# Patient Record
Sex: Male | Born: 1975 | Race: Black or African American | Hispanic: No | Marital: Single | Smoking: Current every day smoker
Health system: Southern US, Community
[De-identification: ages and names within clinical notes are randomized; demographics above are authoritative.]

## PROBLEM LIST (undated history)

## (undated) DIAGNOSIS — R079 Chest pain, unspecified: Secondary | ICD-10-CM

## (undated) DIAGNOSIS — Z8669 Personal history of other diseases of the nervous system and sense organs: Secondary | ICD-10-CM

## (undated) DIAGNOSIS — R109 Unspecified abdominal pain: Secondary | ICD-10-CM

## (undated) HISTORY — DX: Unspecified abdominal pain: R10.9

## (undated) HISTORY — DX: Chest pain, unspecified: R07.9

## (undated) HISTORY — DX: Personal history of other diseases of the nervous system and sense organs: Z86.69

---

## 2002-06-03 ENCOUNTER — Emergency Department (HOSPITAL_COMMUNITY): Admission: EM | Admit: 2002-06-03 | Discharge: 2002-06-03 | Payer: Self-pay | Admitting: Emergency Medicine

## 2010-10-24 DIAGNOSIS — Z8669 Personal history of other diseases of the nervous system and sense organs: Secondary | ICD-10-CM

## 2010-10-24 HISTORY — DX: Personal history of other diseases of the nervous system and sense organs: Z86.69

## 2011-10-23 ENCOUNTER — Emergency Department (HOSPITAL_COMMUNITY)
Admission: EM | Admit: 2011-10-23 | Discharge: 2011-10-23 | Disposition: A | Discharge status: Home / Self Care | Payer: Self-pay | Attending: Emergency Medicine | Admitting: Emergency Medicine

## 2011-10-23 ENCOUNTER — Encounter: Payer: Self-pay | Admitting: *Deleted

## 2011-10-23 DIAGNOSIS — L2989 Other pruritus: Secondary | ICD-10-CM | POA: Insufficient documentation

## 2011-10-23 DIAGNOSIS — H5789 Other specified disorders of eye and adnexa: Secondary | ICD-10-CM | POA: Insufficient documentation

## 2011-10-23 DIAGNOSIS — L298 Other pruritus: Secondary | ICD-10-CM | POA: Insufficient documentation

## 2011-10-23 DIAGNOSIS — F17200 Nicotine dependence, unspecified, uncomplicated: Secondary | ICD-10-CM | POA: Insufficient documentation

## 2011-10-23 DIAGNOSIS — H109 Unspecified conjunctivitis: Secondary | ICD-10-CM | POA: Insufficient documentation

## 2011-10-23 MED ORDER — FLUORESCEIN SODIUM 1 MG OP STRP
1.0000 | ORAL_STRIP | Freq: Once | OPHTHALMIC | Status: AC
  Administered 2011-10-23: 1 via OPHTHALMIC
  Filled 2011-10-23: qty 1

## 2011-10-23 MED ORDER — TETRACAINE HCL 0.5 % OP SOLN
2.0000 [drp] | Freq: Once | OPHTHALMIC | Status: AC
  Administered 2011-10-23: 2 [drp] via OPHTHALMIC
  Filled 2011-10-23: qty 2

## 2011-10-23 MED ORDER — GATIFLOXACIN 0.5 % OP SOLN
1.0000 [drp] | Freq: Once | OPHTHALMIC | Status: AC
  Administered 2011-10-23: 1 [drp] via OPHTHALMIC
  Filled 2011-10-23 (×2): qty 2.5

## 2011-10-23 NOTE — ED Provider Notes (Signed)
History     CSN: [id number]  Arrival date & time 10/23/11  1039   First MD Initiated Contact with Patient 10/23/11 1054      Chief Complaint  Patient presents with  . Conjunctivitis    (Consider location/radiation/quality/duration/timing/severity/associated sxs/prior treatment) HPI Patient is a 35 yo M who presents today complaining of left eye redness and itching for the past 4 days.  He has no history of trauma or visual changes.  He has no foreign body sensation.  He is not a contact lens wearer.  He describes only mild pain made worse with light and better with darkness.  There are no other associated or modifying factors. History reviewed. No pertinent past medical history.  History reviewed. No pertinent past surgical history.  History reviewed. No pertinent family history.  History  Substance Use Topics  . Smoking status: Current Everyday Smoker -- 0.5 packs/day    Types: Cigarettes  . Smokeless tobacco: Not on file  . Alcohol Use: No      Review of Systems  Constitutional: Negative.   HENT: Negative.   Eyes: Positive for photophobia, discharge, redness and itching.  Respiratory: Negative.   Cardiovascular: Negative.   Gastrointestinal: Negative.   Genitourinary: Negative.   Musculoskeletal: Negative.   Skin: Negative.   Neurological: Negative.   Hematological: Negative.   Psychiatric/Behavioral: Negative.   All other systems reviewed and are negative.    Allergies  Review of patient's allergies indicates no known allergies.  Home Medications   Current Outpatient Rx  Name Route Sig Dispense Refill  . AMOXICILLIN 500 MG PO CAPS Oral Take 500 mg by mouth once. Antibiotics left over from previous illness.     . IBUPROFEN 200 MG PO TABS Oral Take 400 mg by mouth every 6 (six) hours as needed. For pain       BP 140/93  Pulse 76  Temp(Src) 98.3 F (36.8 C) (Oral)  Resp 18  SpO2 100%  Physical Exam  Nursing note and vitals  reviewed. Constitutional: He is oriented to person, place, and time. He appears well-developed and well-nourished. No distress.  HENT:  Head: Normocephalic and atraumatic.  Eyes: EOM are normal. Pupils are equal, round, and reactive to light. Left conjunctiva is injected.  Slit lamp exam:      The left eye shows no fluorescein uptake.  Neck: Normal range of motion.  Cardiovascular: Normal rate.   Pulmonary/Chest: Effort normal.  Musculoskeletal: Normal range of motion.  Neurological: He is alert and oriented to person, place, and time. No cranial nerve deficit.  Skin: Skin is warm and dry.  Psychiatric: He has a normal mood and affect.    ED Course  Procedures (including critical care time)  Labs Reviewed - No data to display No results found.   1. Conjunctivitis       MDM  Patient was evaluated and had presentation consistent with conjunctivitis.  He was treated with gatifloxacin.  He had no signs of corneal abrasion or history findings consistent with other concerning causes of eye redness. Patient was discharged in good condition with eye drops.        Cyndra Numbers, MD 10/23/11 2209

## 2011-10-23 NOTE — ED Notes (Signed)
Pt d/c home in NAD.

## 2011-10-23 NOTE — ED Notes (Signed)
MD at bedside. 

## 2011-10-23 NOTE — ED Notes (Signed)
Reports itching and redness to left eye since 12/25.

## 2014-01-20 ENCOUNTER — Emergency Department (HOSPITAL_COMMUNITY)
Admission: EM | Admit: 2014-01-20 | Discharge: 2014-01-20 | Disposition: A | Discharge status: Home / Self Care | Payer: Self-pay | Attending: Emergency Medicine | Admitting: Emergency Medicine

## 2014-01-20 ENCOUNTER — Encounter (HOSPITAL_COMMUNITY): Payer: Self-pay | Admitting: Emergency Medicine

## 2014-01-20 ENCOUNTER — Emergency Department (HOSPITAL_COMMUNITY): Payer: Self-pay

## 2014-01-20 DIAGNOSIS — R109 Unspecified abdominal pain: Secondary | ICD-10-CM

## 2014-01-20 DIAGNOSIS — R079 Chest pain, unspecified: Secondary | ICD-10-CM

## 2014-01-20 DIAGNOSIS — R0789 Other chest pain: Secondary | ICD-10-CM | POA: Insufficient documentation

## 2014-01-20 DIAGNOSIS — F17200 Nicotine dependence, unspecified, uncomplicated: Secondary | ICD-10-CM | POA: Insufficient documentation

## 2014-01-20 HISTORY — DX: Unspecified abdominal pain: R10.9

## 2014-01-20 HISTORY — DX: Chest pain, unspecified: R07.9

## 2014-01-20 LAB — URINALYSIS, ROUTINE W REFLEX MICROSCOPIC
Bilirubin Urine: NEGATIVE
GLUCOSE, UA: NEGATIVE mg/dL
Hgb urine dipstick: NEGATIVE
Ketones, ur: NEGATIVE mg/dL
LEUKOCYTES UA: NEGATIVE
Nitrite: NEGATIVE
PH: 6.5 (ref 5.0–8.0)
PROTEIN: NEGATIVE mg/dL
SPECIFIC GRAVITY, URINE: 1.026 (ref 1.005–1.030)
Urobilinogen, UA: 1 mg/dL (ref 0.0–1.0)

## 2014-01-20 LAB — CBC
HEMATOCRIT: 45.4 % (ref 39.0–52.0)
HEMOGLOBIN: 15.8 g/dL (ref 13.0–17.0)
MCH: 32.5 pg (ref 26.0–34.0)
MCHC: 34.8 g/dL (ref 30.0–36.0)
MCV: 93.4 fL (ref 78.0–100.0)
Platelets: 218 10*3/uL (ref 150–400)
RBC: 4.86 MIL/uL (ref 4.22–5.81)
RDW: 14.5 % (ref 11.5–15.5)
WBC: 8.1 10*3/uL (ref 4.0–10.5)

## 2014-01-20 LAB — I-STAT TROPONIN, ED: Troponin i, poc: 0 ng/mL (ref 0.00–0.08)

## 2014-01-20 LAB — BASIC METABOLIC PANEL
BUN: 12 mg/dL (ref 6–23)
CHLORIDE: 103 meq/L (ref 96–112)
CO2: 27 meq/L (ref 19–32)
Calcium: 9.1 mg/dL (ref 8.4–10.5)
Creatinine, Ser: 0.99 mg/dL (ref 0.50–1.35)
GFR calc non Af Amer: >90 mL/min (ref >90)
GLUCOSE: 97 mg/dL (ref 70–99)
POTASSIUM: 4.5 meq/L (ref 3.7–5.3)
Sodium: 143 mEq/L (ref 137–147)

## 2014-01-20 MED ORDER — OXYCODONE-ACETAMINOPHEN 5-325 MG PO TABS
2.0000 | ORAL_TABLET | Freq: Once | ORAL | Status: AC
  Administered 2014-01-20: 2 via ORAL
  Filled 2014-01-20: qty 2

## 2014-01-20 MED ORDER — OXYCODONE-ACETAMINOPHEN 5-325 MG PO TABS: 1.0000 | ORAL_TABLET | ORAL | Status: DC | PRN

## 2014-01-20 NOTE — Discharge Instructions (Signed)

## 2014-01-20 NOTE — ED Notes (Signed)
Per pt sts he has been having epigastric pain since about Saturday. Denies N,V,D. sts the pain is constant and hurts when he breathes, laughes and moves a certain way.

## 2014-01-20 NOTE — ED Provider Notes (Signed)
This patient was seen and evaluated in conjunction with the resident physician, Dr. Marcha SoldersBrtalik.  The documentation accurately reflects the patient's ED evaluation.  On my exam, the patient was in no distress.  We discussed all findings, the need for smoking cessation, and the need for further E/M as an outpatient.  I saw the ECG, SR, 80, non-ischemic. Unremarkable.  Alejandro Munchobert Assad Harbeson, MD 01/20/14 [phone]

## 2014-01-20 NOTE — ED Provider Notes (Signed)
CSN: [id number]     Arrival date & time 01/20/14  1133 History   First MD Initiated Contact with Patient 01/20/14 1652     Chief Complaint  Patient presents with  . Abdominal Pain     (Consider location/radiation/quality/duration/timing/severity/associated sxs/prior Treatment) Patient is a 38 y.o. male presenting with chest pain. The history is provided by the patient.  Chest Pain Pain location:  Substernal area Pain quality: aching   Pain radiates to:  Does not radiate Pain radiates to the back: no   Pain severity now: no pain at rest, mild pain wiht movement. Onset quality:  Gradual Duration:  3 days Timing:  Constant Progression:  Unchanged Chronicity:  New Context: not breathing and not at rest   Context comment:  Pain with moving certain ways that reproduces pain Relieved by:  Rest Worsened by:  Certain positions Ineffective treatments:  None tried Associated symptoms: no abdominal pain, no altered mental status, no back pain, no cough, no dizziness, no fever, no lower extremity edema, no nausea, no numbness, no PND, no shortness of breath, no syncope, not vomiting and no weakness   Risk factors: male sex and smoking   Risk factors: no coronary artery disease, no diabetes mellitus, no high cholesterol, no hypertension, not obese and no prior DVT/PE     History reviewed. No pertinent past medical history. History reviewed. No pertinent past surgical history. History reviewed. No pertinent family history. History  Substance Use Topics  . Smoking status: Current Every Day Smoker -- 0.50 packs/day    Types: Cigarettes  . Smokeless tobacco: Not on file  . Alcohol Use: No    Review of Systems  Constitutional: Negative for fever, activity change and appetite change.  HENT: Negative for congestion and rhinorrhea.   Eyes: Negative for discharge and itching.  Respiratory: Negative for cough, shortness of breath and wheezing.   Cardiovascular: Positive for chest pain.  Negative for syncope and PND.  Gastrointestinal: Negative for nausea, vomiting, abdominal pain, diarrhea and constipation.  Genitourinary: Negative for hematuria, decreased urine volume and difficulty urinating.  Musculoskeletal: Negative for back pain.  Skin: Negative for rash and wound.  Neurological: Negative for dizziness, syncope, weakness and numbness.  All other systems reviewed and are negative.      Allergies  Review of patient's allergies indicates no known allergies.  Home Medications   Current Outpatient Rx  Name  Route  Sig  Dispense  Refill  . naproxen sodium (ANAPROX) 220 MG tablet   Oral   Take 440 mg by mouth 2 (two) times daily as needed (for pain).         Marland Kitchen. oxyCODONE-acetaminophen (PERCOCET/ROXICET) 5-325 MG per tablet   Oral   Take 1 tablet by mouth every 4 (four) hours as needed for severe pain.   20 tablet   0    BP 110/77  Pulse 69  Temp(Src) 97.5 F (36.4 C) (Oral)  Resp 19  Ht 6\' 1"  (1.854 m)  Wt 192 lb (87.091 kg)  BMI 25.34 kg/m2  SpO2 98% Physical Exam  Vitals reviewed. Constitutional: He is oriented to person, place, and time. He appears well-developed and well-nourished. No distress.  HENT:  Head: Normocephalic and atraumatic.  Mouth/Throat: Oropharynx is clear and moist. No oropharyngeal exudate.  Eyes: Conjunctivae and EOM are normal. Pupils are equal, round, and reactive to light. Right eye exhibits no discharge. Left eye exhibits no discharge. No scleral icterus.  Neck: Normal range of motion. Neck supple.  Cardiovascular: Normal rate,  regular rhythm, normal heart sounds and intact distal pulses.  Exam reveals no gallop and no friction rub.   No murmur heard. Pulmonary/Chest: Effort normal and breath sounds normal. No respiratory distress. He has no wheezes. He has no rales. He exhibits tenderness.  Reproducible pain with passive and extension rotation of torso. Mild chest ttp  Abdominal: Soft. He exhibits no distension and no  mass. There is no tenderness.  Musculoskeletal: Normal range of motion.  Neurological: He is alert and oriented to person, place, and time. No cranial nerve deficit. He exhibits normal muscle tone. Coordination normal.  Skin: Skin is warm. No rash noted. He is not diaphoretic.    ED Course  Procedures (including critical care time) Labs Review Labs Reviewed  CBC  BASIC METABOLIC PANEL  URINALYSIS, ROUTINE W REFLEX MICROSCOPIC  I-STAT TROPOININ, ED   Imaging Review Dg Chest 2 View  01/20/2014   CLINICAL DATA:  Shortness of breath and epigastric abdominal pain.  EXAM: CHEST - 2 VIEW  COMPARISON:  None  FINDINGS: The heart size and mediastinal contours are within normal limits. There is no evidence of pulmonary edema, consolidation, pneumothorax, nodule or pleural fluid. The visualized skeletal structures are unremarkable.  IMPRESSION: No active disease.   Electronically Signed   By: Irish Lack M.D.   On: 01/20/2014 12:12     EKG Interpretation   Date/Time:  Monday January 20 2014 11:37:51 EDT Ventricular Rate:  80 PR Interval:  162 QRS Duration: 80 QT Interval:  348 QTC Calculation: 401 R Axis:   39 Text Interpretation:  Normal sinus rhythm Normal ECG Sinus rhythm Normal  ECG Confirmed by Gerhard Munch  MD ([id number]) on 01/20/2014 5:16:19 PM      MDM   MDM: 38 y.o. AAM w/ cc: of chest pain for 3 days. Pain localized to substernum states woke up with it 3 days aago. Has no pain at rest but states that when he moves a certain way, or laughs, it hurts. Denies pain with breathing. Denies f/c, abd pain, n/v/d. No hx of pain. No recent travel, surgery, trauma, hemoptysis, hormones. No hx of blood clot. No medical hx. AFVSS, well appearing, NAD. EKG with no ischemic change. Triage labs and CXR normal. Exam c/w MSK pain. No hx of heavy alcohol use. Abd soft, benign. Mild substernal chest tp and reproduction with twisitng. Likely MSK pain. Unlikely ACS with low risk, low history, negative  EKG, young, negative trop. Unlikely PE as PERC negative. Unlikely pancreatitis as no EtOH use, non tender, no vomiting. Will treat with Percocet, follow up PCP as needed. Discharged. Care of case d/w my attending.  Final diagnoses:  Musculoskeletal chest pain    Discharged  Pilar Jarvis, MD 01/20/14 [phone]

## 2014-01-20 NOTE — ED Notes (Signed)
Pt reports he has to go to pick of child but will come back; RN encouraged pt to stay.

## 2014-02-19 ENCOUNTER — Encounter: Payer: Self-pay | Admitting: Cardiovascular Disease

## 2014-03-14 ENCOUNTER — Encounter: Payer: Self-pay | Admitting: Cardiovascular Disease

## 2014-03-18 ENCOUNTER — Telehealth: Payer: Self-pay | Admitting: Cardiovascular Disease

## 2014-03-18 ENCOUNTER — Encounter: Payer: Self-pay | Admitting: Cardiovascular Disease

## 2015-04-04 IMAGING — CR DG CHEST 2V
2 series · 2 of 2 positions shown · non-contrast
Comparison: None

CLINICAL DATA: Shortness of breath and epigastric abdominal pain.

EXAM:
CHEST - 2 VIEW

[w chest pa]
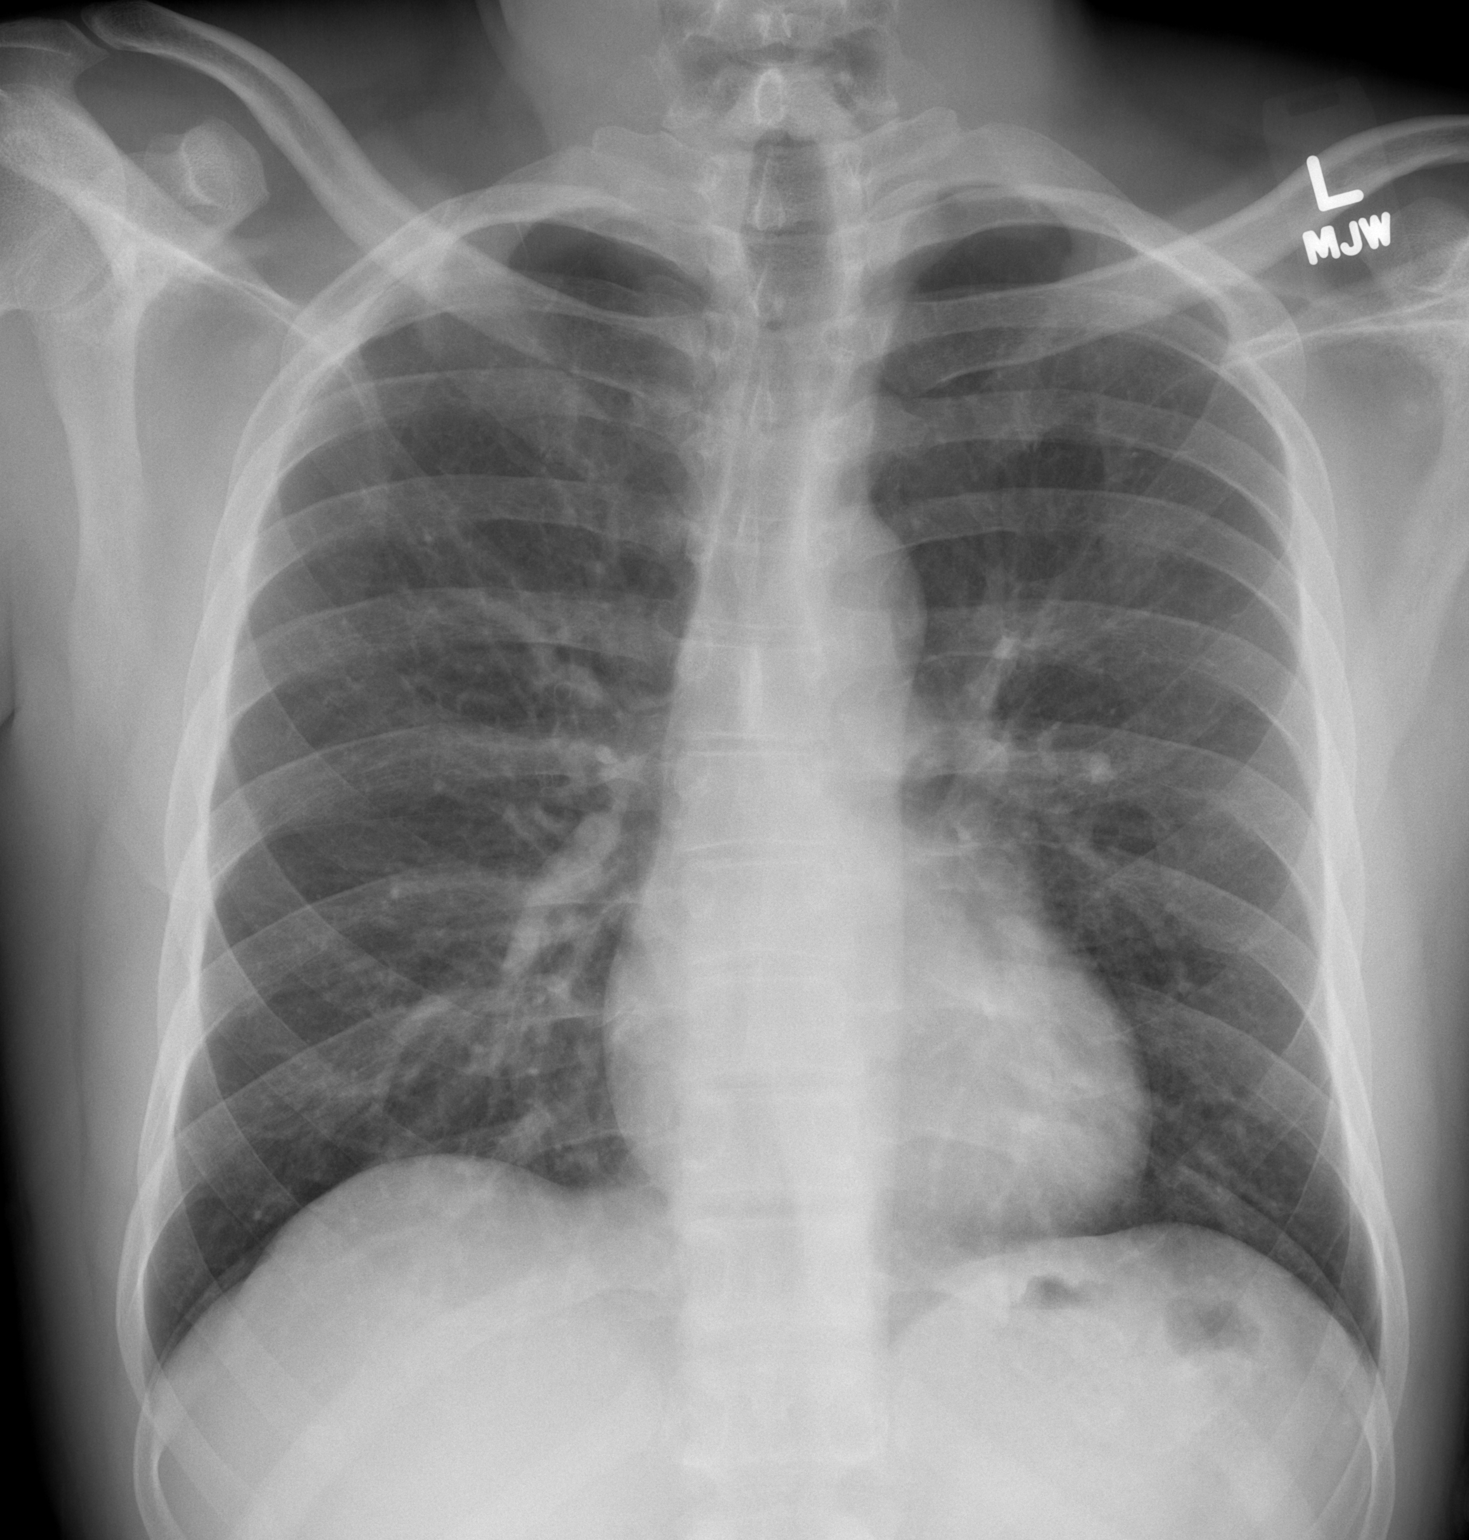

[w chest lat]
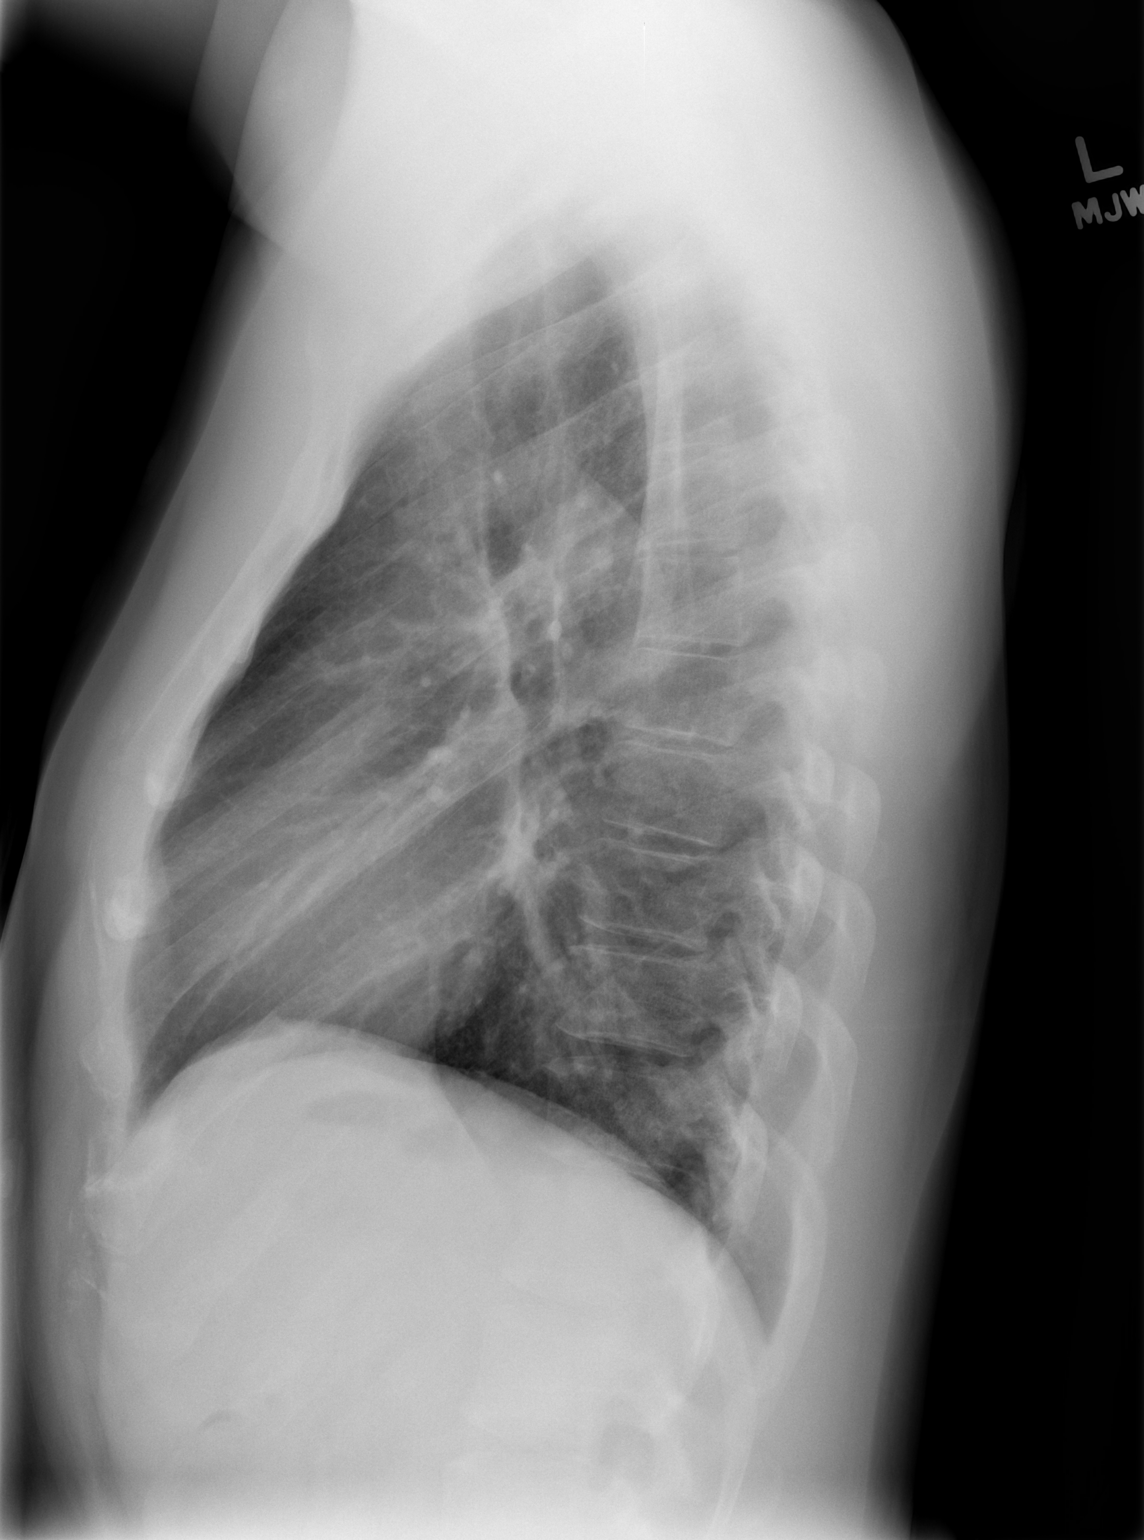

[2 of 2 positions shown; findings below may reference images not displayed]

FINDINGS: The heart size and mediastinal contours are within normal limits.
There is no evidence of pulmonary edema, consolidation,
pneumothorax, nodule or pleural fluid. The visualized skeletal
structures are unremarkable.
IMPRESSION: No active disease.

## 2016-04-12 ENCOUNTER — Ambulatory Visit (HOSPITAL_COMMUNITY)
Admission: EM | Admit: 2016-04-12 | Discharge: 2016-04-12 | Disposition: A | Discharge status: Home / Self Care | Payer: Medicaid Other | Attending: Emergency Medicine | Admitting: Emergency Medicine

## 2016-04-12 ENCOUNTER — Encounter (HOSPITAL_COMMUNITY): Payer: Self-pay | Admitting: Emergency Medicine

## 2016-04-12 DIAGNOSIS — H00013 Hordeolum externum right eye, unspecified eyelid: Secondary | ICD-10-CM

## 2016-04-12 MED ORDER — POLYMYXIN B-TRIMETHOPRIM 10000-0.1 UNIT/ML-% OP SOLN: 1.0000 [drp] | OPHTHALMIC | Status: DC

## 2016-04-12 NOTE — ED Provider Notes (Signed)
CSN: [id number]     Arrival date & time 04/12/16  1703 History   First MD Initiated Contact with Patient 04/12/16 1724     Chief Complaint  Patient presents with  . Eye Problem   (Consider location/radiation/quality/duration/timing/severity/associated sxs/prior Treatment) HPI Alejandro Davis is a 40 y.o. male presenting to UC with c/o Right upper eyelid pain, redness, and swelling for about 3 days.  He was working in a [employer] 4 days ago and woke with the symptoms the next morning. Pain is sore in nature, mild in severity. He reports mild mucous discharge this morning but no other discharge.  He has not tried anything for his symptoms. Denies nasal congestion, sore throat or ear pain. Denies change in vision. He does not wear glasses or contacts.    Past Medical History  Diagnosis Date  . History of conjunctivitis 2012  . Abdominal pain 01/20/14  . Chest pain 01/20/14    Substernal, aching, does not radiate, no pain at rest, mild when moving, x3 days,    History reviewed. No pertinent past surgical history. History reviewed. No pertinent family history. Social History  Substance Use Topics  . Smoking status: Current Every Day Smoker -- 0.50 packs/day    Types: Cigarettes  . Smokeless tobacco: None  . Alcohol Use: No    Review of Systems  HENT: Positive for facial swelling (Left upper eyelid). Negative for rhinorrhea and sore throat.   Eyes: Positive for pain, discharge, redness and itching. Negative for photophobia and visual disturbance.  Skin: Positive for rash. Negative for color change and wound.  Neurological: Negative for dizziness, light-headedness and headaches.    Allergies  Review of patient's allergies indicates no known allergies.  Home Medications   Prior to Admission medications   Medication Sig Start Date End Date Taking? Authorizing Provider  naproxen sodium (ANAPROX) 220 MG tablet Take 440 mg by mouth 2 (two) times daily as needed (for pain).     Historical Provider, MD  oxyCODONE-acetaminophen (PERCOCET/ROXICET) 5-325 MG per tablet Take 1 tablet by mouth every 4 (four) hours as needed for severe pain. 01/20/14   Pilar Jarvisoug Brtalik, MD  trimethoprim-polymyxin b (POLYTRIM) ophthalmic solution Place 1 drop into the right eye every 4 (four) hours. For 5-7 days 04/12/16   Junius FinnerErin O'Malley, PA-C   Meds Ordered and Administered this Visit  Medications - No data to display  BP 113/72 mmHg  Pulse 68  Temp(Src) 98.3 F (36.8 C) (Oral)  Resp 18  SpO2 99% No data found.   Physical Exam  Constitutional: He is oriented to person, place, and time. He appears well-developed and well-nourished.  HENT:  Head: Normocephalic and atraumatic.  Eyes: Conjunctivae and EOM are normal. Pupils are equal, round, and reactive to light. Right eye exhibits hordeolum.  Neck: Normal range of motion.  Cardiovascular: Normal rate.   Pulmonary/Chest: Effort normal.  Musculoskeletal: Normal range of motion.  Neurological: He is alert and oriented to person, place, and time.  Skin: Skin is warm and dry.  Psychiatric: He has a normal mood and affect. His behavior is normal.  Nursing note and vitals reviewed.   ED Course  Procedures (including critical care time)  Labs Review Labs Reviewed - No data to display  Imaging Review No results found.   Visual Acuity Review  Right Eye Distance: 20/30 Left Eye Distance: 20/25 Bilateral Distance: 20/25   MDM   1. Hordeolum of right eye    Pt c/o Right upper eyelid pain and swelling. Exam  c/w hordeolum.  Encouraged warm compresses. If not improving in about 3 days, or if symptoms worsening, may use antibiotic drops.  Rx: gentamicin     Junius Finner, PA-C 04/12/16 1747

## 2016-04-12 NOTE — Discharge Instructions (Signed)
You may try warm compresses such as a warm damp washcloth 3-4 times daily as well as try some over the counter antihistamine such as Benadryl, Claritin, or Zyrtec (generic forms are fine) to help with eye swelling and itching.  If symptoms not improving in 2-3 days, or symptoms continue to worsen, you may start the antibiotic eye drops.  See below for further instructions.   Stye A stye is a bump on your eyelid caused by a bacterial infection. A stye can form inside the eyelid (internal stye) or outside the eyelid (external stye). An internal stye may be caused by an infected oil-producing gland inside your eyelid. An external stye may be caused by an infection at the base of your eyelash (hair follicle). Styes are very common. Anyone can get them at any age. They usually occur in just one eye, but you may have more than one in either eye.  CAUSES  The infection is almost always caused by bacteria called Staphylococcus aureus. This is a common type of bacteria that lives on your skin. RISK FACTORS You may be at higher risk for a stye if you have had one before. You may also be at higher risk if you have:  Diabetes.  Long-term illness.  Long-term eye redness.  A skin condition called seborrhea.  High fat levels in your blood (lipids). SIGNS AND SYMPTOMS  Eyelid pain is the most common symptom of a stye. Internal styes are more painful than external styes. Other signs and symptoms may include:  Painful swelling of your eyelid.  A scratchy feeling in your eye.  Tearing and redness of your eye.  Pus draining from the stye. DIAGNOSIS  Your health care provider may be able to diagnose a stye just by examining your eye. The health care provider may also check to make sure:  You do not have a fever or other signs of a more serious infection.  The infection has not spread to other parts of your eye or areas around your eye. TREATMENT  Most styes will clear up in a few days without  treatment. In some cases, you may need to use antibiotic drops or ointment to prevent infection. Your health care provider may have to drain the stye surgically if your stye is:  Large.  Causing a lot of pain.  Interfering with your vision. This can be done using a thin blade or a needle.  HOME CARE INSTRUCTIONS   Take medicines only as directed by your health care provider.  Apply a clean, warm compress to your eye for 10 minutes, 4 times a day.  Do not wear contact lenses or eye makeup until your stye has healed.  Do not try to pop or drain the stye. SEEK MEDICAL CARE IF:  You have chills or a fever.  Your stye does not go away after several days.  Your stye affects your vision.  Your eyeball becomes swollen, red, or painful. MAKE SURE YOU:  Understand these instructions.  Will watch your condition.  Will get help right away if you are not doing well or get worse.   This information is not intended to replace advice given to you by your health care provider. Make sure you discuss any questions you have with your health care provider.   Document Released: 07/20/2005 Document Revised: 10/31/2014 Document Reviewed: 01/24/2014 Elsevier Interactive Patient Education [employer]2016 Elsevier Inc.

## 2016-04-12 NOTE — ED Notes (Signed)
[facility] a [facility] right eye swelling and soreness when touched. 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

## 2017-10-02 ENCOUNTER — Encounter (HOSPITAL_COMMUNITY): Payer: Self-pay | Admitting: Emergency Medicine

## 2017-10-02 ENCOUNTER — Ambulatory Visit (HOSPITAL_COMMUNITY)
Admission: EM | Admit: 2017-10-02 | Discharge: 2017-10-02 | Disposition: A | Discharge status: Home / Self Care | Payer: Medicaid Other | Attending: Family Medicine | Admitting: Family Medicine

## 2017-10-02 DIAGNOSIS — M5412 Radiculopathy, cervical region: Secondary | ICD-10-CM

## 2017-10-02 MED ORDER — PREDNISONE 20 MG PO TABS: ORAL_TABLET | ORAL | 0 refills | Status: DC

## 2017-10-02 NOTE — ED Triage Notes (Signed)
PT C/O: left arm tingly/numbness ... sts it starts from thumb and radiates all the way to left shoulder... Unable to grip  ONSET: 2 weeks ++   SX ALSO INCLUDE:   DENIES: inj/trauma  TAKING MEDS: none  A&O x4... NAD... Ambulatory

## 2017-10-02 NOTE — Discharge Instructions (Signed)
Please return or see you primary care provider in 2-3 days if not improving

## 2017-10-02 NOTE — ED Provider Notes (Signed)
[facility]   [id number] 10/02/17 Arrival Time: 1630   SUBJECTIVE:  Alejandro Davis is a 41 y.o. male who presents to the urgent care with complaint of left arm tingly/numbness ... sts it starts from thumb and radiates all the way to left shoulder... Unable to grip as well as he could before.   Past Medical History:  Diagnosis Date  . Abdominal pain 01/20/14  . Chest pain 01/20/14   Substernal, aching, does not radiate, no pain at rest, mild when moving, x3 days,   . History of conjunctivitis 2012   History reviewed. No pertinent family history. Social History   Socioeconomic History  . Marital status: Single    Spouse name: Not on file  . Number of children: Not on file  . Years of education: Not on file  . Highest education level: Not on file  Social Needs  . Financial resource strain: Not on file  . Food insecurity - worry: Not on file  . Food insecurity - inability: Not on file  . Transportation needs - medical: Not on file  . Transportation needs - non-medical: Not on file  Occupational History  . Not on file  Tobacco Use  . Smoking status: Current Every Day Smoker    Packs/day: 0.50    Types: Cigarettes  . Smokeless tobacco: Never Used  Substance and Sexual Activity  . Alcohol use: No  . Drug use: No  . Sexual activity: Not on file  Other Topics Concern  . Not on file  Social History Narrative  . Not on file   No outpatient medications have been marked as taking for the 10/02/17 encounter [facility](Hospital Encounter).   No Known Allergies    ROS: As per HPI, remainder of ROS negative.   OBJECTIVE:   Vitals:   10/02/17 1655  BP: 138/81  Pulse: 70  Resp: 20  Temp: 98.9 F (37.2 C)  TempSrc: Oral  SpO2: 100%     General appearance: alert; no distress Eyes: PERRL; EOMI; conjunctiva normal HENT: normocephalic; atraumatic; TMs normal, canal normal, external ears normal without trauma; nasal mucosa normal; oral mucosa normal Neck: supple,  nontender Back: no CVA tenderness Extremities: no cyanosis or edema; symmetrical with no gross deformities Skin: warm and dry Neurologic: normal gait; grossly normal Psychological: alert and cooperative; normal mood and affect      Labs:  Results for orders placed or performed during the hospital encounter of 01/20/14  CBC  Result Value Ref Range   WBC 8.1 4.0 - 10.5 K/uL   RBC 4.86 4.22 - 5.81 MIL/uL   Hemoglobin 15.8 13.0 - 17.0 g/dL   HCT 40.945.4 81.139.0 - 91.452.0 %   MCV 93.4 78.0 - 100.0 fL   MCH 32.5 26.0 - 34.0 pg   MCHC 34.8 30.0 - 36.0 g/dL   RDW 78.214.5 95.611.5 - 21.315.5 %   Platelets 218 150 - 400 K/uL  Basic metabolic panel  Result Value Ref Range   Sodium 143 137 - 147 mEq/L   Potassium 4.5 3.7 - 5.3 mEq/L   Chloride 103 96 - 112 mEq/L   CO2 27 19 - 32 mEq/L   Glucose, Bld 97 70 - 99 mg/dL   BUN 12 6 - 23 mg/dL   Creatinine, Ser 0.860.99 0.50 - 1.35 mg/dL   Calcium 9.1 8.4 - 57.810.5 mg/dL   GFR calc non Af Amer >90 >90 mL/min   GFR calc Af Amer >90 >90 mL/min  Urinalysis, Routine w reflex microscopic  Result  Value Ref Range   Color, Urine YELLOW YELLOW   APPearance CLEAR CLEAR   Specific Gravity, Urine 1.026 1.005 - 1.030   pH 6.5 5.0 - 8.0   Glucose, UA NEGATIVE NEGATIVE mg/dL   Hgb urine dipstick NEGATIVE NEGATIVE   Bilirubin Urine NEGATIVE NEGATIVE   Ketones, ur NEGATIVE NEGATIVE mg/dL   Protein, ur NEGATIVE NEGATIVE mg/dL   Urobilinogen, UA 1.0 0.0 - 1.0 mg/dL   Nitrite NEGATIVE NEGATIVE   Leukocytes, UA NEGATIVE NEGATIVE  I-stat troponin, ED (not at [facility])  Result Value Ref Range   Troponin i, poc 0.00 0.00 - 0.08 ng/mL   Comment 3            Labs Reviewed - No data to display  No results found.     ASSESSMENT & PLAN:  1. Cervical radiculopathy     Meds ordered this encounter  Medications  . predniSONE (DELTASONE) 20 MG tablet    Sig: Two daily with food    Dispense:  10 tablet    Refill:  0    Reviewed expectations re: course of current medical  issues. Questions answered. Outlined signs and symptoms indicating need for more acute intervention. Patient verbalized understanding. After Visit Summary given.    Procedures:      Elvina SidleLauenstein, Hasina Kreager, MD 10/02/17 [phone]

## 2018-05-23 ENCOUNTER — Encounter (HOSPITAL_COMMUNITY): Payer: Self-pay

## 2018-05-23 ENCOUNTER — Ambulatory Visit (HOSPITAL_COMMUNITY)
Admission: EM | Admit: 2018-05-23 | Discharge: 2018-05-23 | Disposition: A | Discharge status: Home / Self Care | Payer: Medicaid Other | Attending: Internal Medicine | Admitting: Internal Medicine

## 2018-05-23 DIAGNOSIS — M5441 Lumbago with sciatica, right side: Secondary | ICD-10-CM | POA: Diagnosis not present

## 2018-05-23 MED ORDER — MELOXICAM 7.5 MG PO TABS: 15.0000 mg | ORAL_TABLET | Freq: Every day | ORAL | 0 refills | Status: DC

## 2018-05-23 MED ORDER — CYCLOBENZAPRINE HCL 10 MG PO TABS: 10.0000 mg | ORAL_TABLET | Freq: Two times a day (BID) | ORAL | 0 refills | Status: DC | PRN

## 2018-05-23 NOTE — ED Triage Notes (Signed)
Pt presents with back pain.  

## 2018-05-23 NOTE — Discharge Instructions (Addendum)
Use anti-inflammatories for pain/swelling. You may take mobic 1-2 tablets per day. You may supplement Ibuprofen with Tylenol [phone] mg every 8 hours.   You may use flexeril as needed to help with pain. This is a muscle relaxer and causes sedation- please use only at bedtime or when you will be home and not have to drive/work- start with 1/2 tablet  Ice and heat   Follow up if symptoms worsening, not improving in 1-2 weeks, developing numbness or tingling, loss of bowel or bladder control

## 2018-05-24 NOTE — ED Provider Notes (Signed)
[facility]    CSN: [id number] Arrival date & time: 05/23/18  1955     History   Chief Complaint Chief Complaint  Patient presents with  . Back Pain    HPI Alejandro Davis is a 42 y.o. male no continued past medical history presenting today for evaluation of back pain.  States that his back pain began last Friday, and have persisted for the past 5 to 6 days.  He he notes as he was walking to his neighbor had his house, stepped over a retaining wall, and as he put weight on his foot to go over the wall, hitting felt a sharp pain in his right lower back.  Pain radiates down his right leg.  Also notes the day prior he helped a friend move an entertainment center.  Denies any numbness or tingling.  Denies loss of bowel or bladder control.  Denies saddle anesthesia.  He has been taking Aleve without relief.  Denies nausea or vomiting.  Pain worsens with specific movements.  HPI  Past Medical History:  Diagnosis Date  . Abdominal pain 01/20/14  . Chest pain 01/20/14   Substernal, aching, does not radiate, no pain at rest, mild when moving, x3 days,   . History of conjunctivitis 2012    There are no active problems to display for this patient.   History reviewed. No pertinent surgical history.     Home Medications    Prior to Admission medications   Medication Sig Start Date End Date Taking? Authorizing Provider  cyclobenzaprine (FLEXERIL) 10 MG tablet Take 1 tablet (10 mg total) by mouth 2 (two) times daily as needed for muscle spasms. 05/23/18   Ahmere Hemenway C, PA-C  meloxicam (MOBIC) 7.5 MG tablet Take 2 tablets (15 mg total) by mouth daily. 05/23/18   Thatcher Doberstein, Junius Creamer, PA-C    Family History History reviewed. No pertinent family history.  Social History Social History   Tobacco Use  . Smoking status: Current Every Day Smoker    Packs/day: 0.50    Types: Cigarettes  . Smokeless tobacco: Never Used  Substance Use Topics  . Alcohol use: No  . Drug use: No       Allergies   Patient has no known allergies.   Review of Systems Review of Systems  Constitutional: Negative for fatigue and fever.  Eyes: Negative for photophobia, pain and visual disturbance.  Respiratory: Negative for cough and shortness of breath.   Cardiovascular: Negative for chest pain.  Gastrointestinal: Negative for abdominal pain, nausea and vomiting.  Genitourinary: Negative for decreased urine volume and hematuria.  Musculoskeletal: Positive for back pain, gait problem and myalgias. Negative for neck pain and neck stiffness.  Skin: Negative for rash.  Neurological: Negative for dizziness, syncope, facial asymmetry, weakness, light-headedness, numbness and headaches.     Physical Exam Triage Vital Signs ED Triage Vitals  Enc Vitals Group     BP 05/23/18 2022 124/78     Pulse Rate 05/23/18 2022 68     Resp 05/23/18 2022 18     Temp 05/23/18 2022 98.2 F (36.8 C)     Temp Source 05/23/18 2022 Oral     SpO2 05/23/18 2022 99 %     Weight --      Height --      Head Circumference --      Peak Flow --      Pain Score 05/23/18 2025 4     Pain Loc --  Pain Edu? --      Excl. in GC? --    No data found.  Updated Vital Signs BP 124/78 (BP Location: Right Arm)   Pulse 68   Temp 98.2 F (36.8 C) (Oral)   Resp 18   SpO2 99%   Visual Acuity Right Eye Distance:   Left Eye Distance:   Bilateral Distance:    Right Eye Near:   Left Eye Near:    Bilateral Near:     Physical Exam  Constitutional: He appears well-developed and well-nourished.  HENT:  Head: Normocephalic and atraumatic.  Eyes: Conjunctivae are normal.  Neck: Neck supple.  Cardiovascular: Normal rate and regular rhythm.  No murmur heard. Pulmonary/Chest: Effort normal and breath sounds normal. No respiratory distress.  Abdominal: Soft. There is no tenderness.  Musculoskeletal: He exhibits no edema.  Nontender to palpation of entire cervical, thoracic and lumbar spine, nontender to  palpation of right and left lateral lumbar musculature.  Positive straight leg raise on right.  Strength 5/5 and equal bilaterally at hips in all directions, patellar reflex 2+  Able to ambulate from chair to exam table without difficulty  Neurological: He is alert.  Skin: Skin is warm and dry.  Psychiatric: He has a normal mood and affect.  Nursing note and vitals reviewed.    UC Treatments / Results  Labs (all labs ordered are listed, but only abnormal results are displayed) Labs Reviewed - No data to display  EKG None  Radiology No results found.  Procedures Procedures (including critical care time)  Medications Ordered in UC Medications - No data to display  Initial Impression / Assessment and Plan / UC Course  I have reviewed the triage vital signs and the nursing notes.  Pertinent labs & imaging results that were available during my care of the patient were reviewed by me and considered in my medical decision making (see chart for details).     Patient likely with lumbar strain versus sciatica.  Will recommend treatment with anti-inflammatories and muscle relaxer.  Ice, heat and activity modification.  Advised against total bedrest, but advised to avoid heavy lifting.Discussed strict return precautions. Patient verbalized understanding and is agreeable with plan.  Final Clinical Impressions(s) / UC Diagnoses   Final diagnoses:  Acute right-sided low back pain with right-sided sciatica     Discharge Instructions     Use anti-inflammatories for pain/swelling. You may take mobic 1-2 tablets per day. You may supplement Ibuprofen with Tylenol [phone] mg every 8 hours.   You may use flexeril as needed to help with pain. This is a muscle relaxer and causes sedation- please use only at bedtime or when you will be home and not have to drive/work- start with 1/2 tablet  Ice and heat   Follow up if symptoms worsening, not improving in 1-2 weeks, developing numbness or  tingling, loss of bowel or bladder control    ED Prescriptions    Medication Sig Dispense Auth. Provider   cyclobenzaprine (FLEXERIL) 10 MG tablet Take 1 tablet (10 mg total) by mouth 2 (two) times daily as needed for muscle spasms. 20 tablet Darielle Hancher C, PA-C   meloxicam (MOBIC) 7.5 MG tablet Take 2 tablets (15 mg total) by mouth daily. 40 tablet Kazumi Lachney, BurnsHallie C, PA-C     Controlled Substance Prescriptions [place] Controlled Substance Registry consulted? Not Applicable   Lew DawesWieters, Cadon Raczka C, [place]C 05/24/18 1015

## 2021-06-16 ENCOUNTER — Ambulatory Visit (HOSPITAL_COMMUNITY)
Admission: EM | Admit: 2021-06-16 | Discharge: 2021-06-16 | Disposition: A | Discharge status: Home / Self Care | Payer: Medicaid Other | Attending: Internal Medicine | Admitting: Internal Medicine

## 2021-06-16 ENCOUNTER — Other Ambulatory Visit: Payer: Self-pay

## 2021-06-16 ENCOUNTER — Encounter (HOSPITAL_COMMUNITY): Payer: Self-pay | Admitting: *Deleted

## 2021-06-16 DIAGNOSIS — H1011 Acute atopic conjunctivitis, right eye: Secondary | ICD-10-CM

## 2021-06-16 MED ORDER — LORATADINE 10 MG PO TABS: 10.0000 mg | ORAL_TABLET | Freq: Every day | ORAL | 0 refills | Status: AC

## 2021-06-16 MED ORDER — FLUTICASONE PROPIONATE 50 MCG/ACT NA SUSP: 2.0000 | Freq: Every day | NASAL | 0 refills | Status: AC

## 2021-06-16 NOTE — ED Provider Notes (Signed)
[facility]    CSN: [id number] Arrival date & time: 06/16/21  1002      History   Chief Complaint Chief Complaint  Patient presents with   Eye Problem    HPI Alejandro Davis is a 45 y.o. male.   HPI  Eye puffiness: Patient states that he woke up and took the trash out to the curb.  He states that after that he developed a bit of itching in his right thigh and noticed that the eye became puffy and felt like he had something in it.  He denies any known traumas or anything that could have gotten in the eye.  No metal work.  He denies any changes in vision, fevers or eyeball pain.  He has not tried anything for symptoms.  He is not up-to-date with seeing the eye specialist and does not wear contacts.  No family history of glaucoma.  Past Medical History:  Diagnosis Date   Abdominal pain 01/20/14   Chest pain 01/20/14   Substernal, aching, does not radiate, no pain at rest, mild when moving, x3 days,    History of conjunctivitis 2012    There are no problems to display for this patient.   History reviewed. No pertinent surgical history.     Home Medications    Prior to Admission medications   Medication Sig Start Date End Date Taking? Authorizing Provider  cyclobenzaprine (FLEXERIL) 10 MG tablet Take 1 tablet (10 mg total) by mouth 2 (two) times daily as needed for muscle spasms. 05/23/18   Wieters, Hallie C, PA-C  meloxicam (MOBIC) 7.5 MG tablet Take 2 tablets (15 mg total) by mouth daily. 05/23/18   Wieters, Junius Creamer, PA-C    Family History History reviewed. No pertinent family history.  Social History Social History   Tobacco Use   Smoking status: Every Day    Packs/day: 0.50    Types: Cigarettes   Smokeless tobacco: Never  Substance Use Topics   Alcohol use: No   Drug use: No     Allergies   Patient has no known allergies.   Review of Systems Review of Systems  As stated above in HPI Physical Exam Triage Vital Signs ED Triage Vitals  Enc  Vitals Group     BP 06/16/21 1108 114/74     Pulse Rate 06/16/21 1108 (!) 59     Resp 06/16/21 1108 18     Temp 06/16/21 1108 (!) 97.5 F (36.4 C)     Temp src --      SpO2 06/16/21 1108 100 %     Weight --      Height --      Head Circumference --      Peak Flow --      Pain Score 06/16/21 1111 8     Pain Loc --      Pain Edu? --      Excl. in GC? --    No data found.  Updated Vital Signs BP 114/74   Pulse (!) 59   Temp (!) 97.5 F (36.4 C)   Resp 18   SpO2 100%   Visual Acuity Right Eye Distance: 20/20 Left Eye Distance: 20/20 Bilateral Distance: 20/15  Right Eye Near:   Left Eye Near:    Bilateral Near:     Physical Exam Vitals and nursing note reviewed.  Constitutional:      General: He is not in acute distress.    Appearance: Normal appearance. He is not  ill-appearing, toxic-appearing or diaphoretic.  HENT:     Head: Normocephalic and atraumatic.  Eyes:     Extraocular Movements: Extraocular movements intact.     Pupils: Pupils are equal, round, and reactive to light.     Comments: No foreign body visualized. Mild increase of clear discharge. Puffiness of superior and inferior eyelids.   Cardiovascular:     Rate and Rhythm: Normal rate and regular rhythm.  Skin:    General: Skin is warm.  Neurological:     Mental Status: He is alert.     UC Treatments / Results  Labs (all labs ordered are listed, but only abnormal results are displayed) Labs Reviewed - No data to display  EKG   Radiology No results found.  Procedures Procedures (including critical care time)  Medications Ordered in UC Medications - No data to display  Initial Impression / Assessment and Plan / UC Course  I have reviewed the triage vital signs and the nursing notes.  Pertinent labs & imaging results that were available during my care of the patient were reviewed by me and considered in my medical decision making (see chart for details).     New. Allergic  conjunctivitis. Discussed medications that may be helpful. Discussed red flag signs and symptoms. Follow up PRN.  Final Clinical Impressions(s) / UC Diagnoses   Final diagnoses:  None   Discharge Instructions   None    ED Prescriptions   None    PDMP not reviewed this encounter.   Rushie Chestnut, [place] 06/16/21 1159

## 2021-06-16 NOTE — Discharge Instructions (Addendum)
Cetirizine eyedrops which are available over-the-counter at the pharmacy

## 2021-06-16 NOTE — ED Triage Notes (Signed)
Pt reports he woke with pain and drainage in RT eye. No known injury.

## 2022-04-11 ENCOUNTER — Other Ambulatory Visit: Payer: Self-pay

## 2022-04-11 ENCOUNTER — Ambulatory Visit (HOSPITAL_COMMUNITY)
Admission: EM | Admit: 2022-04-11 | Discharge: 2022-04-11 | Disposition: A | Discharge status: Home / Self Care | Payer: Medicaid Other | Attending: Emergency Medicine | Admitting: Emergency Medicine

## 2022-04-11 ENCOUNTER — Encounter (HOSPITAL_COMMUNITY): Payer: Self-pay | Admitting: *Deleted

## 2022-04-11 DIAGNOSIS — K047 Periapical abscess without sinus: Secondary | ICD-10-CM | POA: Diagnosis not present

## 2022-04-11 MED ORDER — TRAMADOL HCL 50 MG PO TABS: 50.0000 mg | ORAL_TABLET | Freq: Four times a day (QID) | ORAL | 0 refills | Status: AC | PRN

## 2022-04-11 MED ORDER — LIDOCAINE VISCOUS HCL 2 % MT SOLN: 15.0000 mL | OROMUCOSAL | 0 refills | Status: DC | PRN

## 2022-04-11 MED ORDER — AMOXICILLIN-POT CLAVULANATE 875-125 MG PO TABS: 1.0000 | ORAL_TABLET | Freq: Two times a day (BID) | ORAL | 0 refills | Status: DC

## 2022-04-11 NOTE — ED Provider Notes (Addendum)
[facility]    CSN: [id number] Arrival date & time: 04/11/22  1244      History   Chief Complaint Chief Complaint  Patient presents with   Facial Swelling   Dental Pain    HPI Irven Ingalsbe is a 46 y.o. male.   Patient presents with right upper dental pain beginning last night.  Endorses it feels that his tooth is loose.  Associated right-sided facial swelling with pain radiating to the temple.  Has not attempted treatment of symptoms.  Unable to tolerate food or liquids today.  Has establish care by dentist.  Denies fever, chills, drainage, difficulty swallowing, sore throat.    Past Medical History:  Diagnosis Date   Abdominal pain 01/20/14   Chest pain 01/20/14   Substernal, aching, does not radiate, no pain at rest, mild when moving, x3 days,    History of conjunctivitis 2012    There are no problems to display for this patient.   History reviewed. No pertinent surgical history.     Home Medications    Prior to Admission medications   Medication Sig Start Date End Date Taking? Authorizing Provider  cyclobenzaprine (FLEXERIL) 10 MG tablet Take 1 tablet (10 mg total) by mouth 2 (two) times daily as needed for muscle spasms. 05/23/18   Wieters, Hallie C, PA-C  fluticasone (FLONASE) 50 MCG/ACT nasal spray Place 2 sprays into both nostrils daily. 06/16/21   Rushie Chestnut, PA-C  loratadine (CLARITIN) 10 MG tablet Take 1 tablet (10 mg total) by mouth daily. 06/16/21   Rushie Chestnut, PA-C  meloxicam (MOBIC) 7.5 MG tablet Take 2 tablets (15 mg total) by mouth daily. 05/23/18   Wieters, Junius Creamer, PA-C    Family History History reviewed. No pertinent family history.  Social History Social History   Tobacco Use   Smoking status: Every Day    Packs/day: 0.50    Types: Cigarettes   Smokeless tobacco: Never  Substance Use Topics   Alcohol use: No   Drug use: No     Allergies   Patient has no known allergies.   Review of Systems Review of  Systems Defer to HPI   Physical Exam Triage Vital Signs ED Triage Vitals  Enc Vitals Group     BP 04/11/22 1432 (!) 150/93     Pulse Rate 04/11/22 1432 68     Resp 04/11/22 1432 18     Temp 04/11/22 1432 97.6 F (36.4 C)     Temp src --      SpO2 04/11/22 1432 99 %     Weight --      Height --      Head Circumference --      Peak Flow --      Pain Score 04/11/22 1430 7     Pain Loc --      Pain Edu? --      Excl. in GC? --    No data found.  Updated Vital Signs BP (!) 150/93   Pulse 68   Temp 97.6 F (36.4 C)   Resp 18   SpO2 99%   Visual Acuity Right Eye Distance:   Left Eye Distance:   Bilateral Distance:    Right Eye Near:   Left Eye Near:    Bilateral Near:     Physical Exam Constitutional:      Appearance: Normal appearance.  HENT:     Head: Normocephalic.     Mouth/Throat:     Comments:  Abscess noted to the right upper gumline with mild to moderate gingival swelling and dental decay, pharynx is clear Eyes:     Extraocular Movements: Extraocular movements intact.  Pulmonary:     Effort: Pulmonary effort is normal.  Neurological:     Mental Status: He is alert and oriented to person, place, and time. Mental status is at baseline.  Psychiatric:        Mood and Affect: Mood normal.        Behavior: Behavior normal.      UC Treatments / Results  Labs (all labs ordered are listed, but only abnormal results are displayed) Labs Reviewed - No data to display  EKG   Radiology No results found.  Procedures Procedures (including critical care time)  Medications Ordered in UC Medications - No data to display  Initial Impression / Assessment and Plan / UC Course  I have reviewed the triage vital signs and the nursing notes.  Pertinent labs & imaging results that were available during my care of the patient were reviewed by me and considered in my medical decision making (see chart for details).  Dental abscess  Vital signs are stable, no  fever noted in triage, no signs of sepsis at this time, Augmentin 7-day course prescribed as well as viscous lidocaine and tramadol for outpatient management, may attempt additional supportive measures such as salt water gargles, soft foods and warm liquids throat lozenges, advised patient to increase fluid intake until able to tolerate solids, advised patient to make follow-up appointment with dentist for after completion of antibiotics, patient requesting pain medication here in office, unable to give narcotic as patient drove self Final Clinical Impressions(s) / UC Diagnoses   Final diagnoses:  None   Discharge Instructions   None    ED Prescriptions   None    PDMP not reviewed this encounter.   Valinda Hoar, NP 04/11/22 1523    Valinda Hoar, NP 04/11/22 1523

## 2022-04-11 NOTE — Discharge Instructions (Addendum)
On exam a new pocket infection was noted to the right upper gumline  Take Augmentin twice daily for the next 7 days, ideally you will begin to see improvement in the next 24 to 48 hours and steady progression from there  You may gargle and spit lidocaine solution every 4 hours as needed for temporary relief  You can attempt Tylenol 500 to 1000 mg every 6 hours and/or ibuprofen 800 mg every 8 hours for management of pain  If ineffective you may attempt tramadol every 6 hours, use sparingly as you will only be dispensed 15 tablets, be mindful this may make you drowsy  Please notify your dentist and attempt to schedule appointment for evaluation next week after completion of antibiotics

## 2022-04-11 NOTE — ED Triage Notes (Signed)
Pt reports Lt facial swelling and dental problem.

## 2022-05-05 ENCOUNTER — Ambulatory Visit (HOSPITAL_COMMUNITY)
Admission: EM | Admit: 2022-05-05 | Discharge: 2022-05-05 | Disposition: A | Discharge status: Home / Self Care | Payer: Medicaid Other | Attending: Internal Medicine | Admitting: Internal Medicine

## 2022-05-05 ENCOUNTER — Encounter (HOSPITAL_COMMUNITY): Payer: Self-pay | Admitting: Emergency Medicine

## 2022-05-05 ENCOUNTER — Other Ambulatory Visit: Payer: Self-pay

## 2022-05-05 DIAGNOSIS — K047 Periapical abscess without sinus: Secondary | ICD-10-CM | POA: Diagnosis not present

## 2022-05-05 MED ORDER — AMOXICILLIN-POT CLAVULANATE 875-125 MG PO TABS: 1.0000 | ORAL_TABLET | Freq: Two times a day (BID) | ORAL | 0 refills | Status: AC

## 2022-05-05 NOTE — ED Provider Notes (Signed)
[facility]    CSN: [id number] Arrival date & time: 05/05/22  1231      History   Chief Complaint Chief Complaint  Patient presents with   Dental Pain    HPI Alejandro Davis is a 46 y.o. male with a history of dental caries comes to urgent care with 2-day history of right jaw pain.  Patient has dental cavity in the first right mandibular molar.  Pain is throbbing in nature, currently 8 out of 10 and aggravated by chewing.  Pain was partially relieved by pain medication but the pain has worsened over the past 6 to 12 hours.  It is associated with swelling of the right jaw.  No fever or chills.  No discharge from the gum.  No diarrhea, nausea or vomiting.  No trauma to the jaw or arm.  HPI  Past Medical History:  Diagnosis Date   Abdominal pain 01/20/14   Chest pain 01/20/14   Substernal, aching, does not radiate, no pain at rest, mild when moving, x3 days,    History of conjunctivitis 2012    There are no problems to display for this patient.   History reviewed. No pertinent surgical history.     Home Medications    Prior to Admission medications   Medication Sig Start Date End Date Taking? Authorizing Provider  methocarbamol (ROBAXIN) 500 MG tablet Take by mouth. 04/22/22  Yes [provider]  predniSONE (DELTASONE) 10 MG tablet Take by mouth. 04/22/22  Yes [provider]  traMADol (ULTRAM) 50 MG tablet Take 1 tablet (50 mg total) by mouth every 6 (six) hours as needed. 04/11/22  Yes White, Elita Boone, NP  amoxicillin-clavulanate (AUGMENTIN) 875-125 MG tablet Take 1 tablet by mouth every 12 (twelve) hours. 05/05/22   Merrilee Jansky, MD  fluticasone (FLONASE) 50 MCG/ACT nasal spray Place 2 sprays into both nostrils daily. 06/16/21   Rushie Chestnut, PA-C  loratadine (CLARITIN) 10 MG tablet Take 1 tablet (10 mg total) by mouth daily. 06/16/21   Rushie Chestnut, PA-C    Family History History reviewed. No pertinent family history.  Social  History Social History   Tobacco Use   Smoking status: Every Day    Packs/day: 0.50    Types: Cigarettes   Smokeless tobacco: Never  Vaping Use   Vaping Use: Some days  Substance Use Topics   Alcohol use: Yes   Drug use: Yes    Types: Marijuana     Allergies   Patient has no known allergies.   Review of Systems Review of Systems As per HPI  Physical Exam Triage Vital Signs ED Triage Vitals  Enc Vitals Group     BP 05/05/22 1408 (!) 142/88     Pulse Rate 05/05/22 1408 70     Resp 05/05/22 1408 18     Temp 05/05/22 1408 98.3 F (36.8 C)     Temp Source 05/05/22 1408 Oral     SpO2 05/05/22 1408 95 %     Weight --      Height --      Head Circumference --      Peak Flow --      Pain Score 05/05/22 1404 2     Pain Loc --      Pain Edu? --      Excl. in GC? --    No data found.  Updated Vital Signs BP (!) 142/88 (BP Location: Right Arm)   Pulse 70   Temp  98.3 F (36.8 C) (Oral)   Resp 18   SpO2 95%   Visual Acuity Right Eye Distance:   Left Eye Distance:   Bilateral Distance:    Right Eye Near:   Left Eye Near:    Bilateral Near:     Physical Exam Vitals and nursing note reviewed.  Constitutional:      General: He is not in acute distress.    Appearance: He is not ill-appearing.  HENT:     Right Ear: Tympanic membrane normal.     Left Ear: Tympanic membrane normal.     Mouth/Throat:     Mouth: Mucous membranes are moist.     Comments: Dental cavity in the second right premolar.  No gum swelling or discharge.  Mild swelling of the right jaw. Cardiovascular:     Rate and Rhythm: Normal rate and regular rhythm.     Pulses: Normal pulses.     Heart sounds: Normal heart sounds.  Pulmonary:     Effort: Pulmonary effort is normal.     Breath sounds: Normal breath sounds.  Musculoskeletal:        General: Normal range of motion.  Neurological:     Mental Status: He is alert.      UC Treatments / Results  Labs (all labs ordered are listed,  but only abnormal results are displayed) Labs Reviewed - No data to display  EKG   Radiology No results found.  Procedures Procedures (including critical care time)  Medications Ordered in UC Medications - No data to display  Initial Impression / Assessment and Plan / UC Course  I have reviewed the triage vital signs and the nursing notes.  Pertinent labs & imaging results that were available during my care of the patient were reviewed by me and considered in my medical decision making (see chart for details).     1.  Dental infection: Augmentin 875-125 mg twice daily for 7 days Chlorhexidine mouth rinse Continue pain medications as needed Patient is advised to follow-up with a dentist for further management. Final Clinical Impressions(s) / UC Diagnoses   Final diagnoses:  Dental infection     Discharge Instructions      Please take medications as prescribed Continue taking your pain medications as needed Chlorhexidine mouth rinse 2-3 times a day Follow-up with your dentist for dental evaluation.   ED Prescriptions     Medication Sig Dispense Auth. Provider   amoxicillin-clavulanate (AUGMENTIN) 875-125 MG tablet Take 1 tablet by mouth every 12 (twelve) hours. 14 tablet Kasey Hansell, Britta Mccreedy, MD      PDMP not reviewed this encounter.   Merrilee Jansky, MD 05/05/22 1623

## 2022-05-05 NOTE — ED Triage Notes (Signed)
Bottom, right dental pain.  Patient was seen 04/11/2022 for the same, but did not follow up with dentist.  Dental pain has returned

## 2022-05-05 NOTE — Discharge Instructions (Signed)
Please take medications as prescribed Continue taking your pain medications as needed Chlorhexidine mouth rinse 2-3 times a day Follow-up with your dentist for dental evaluation.
